# Patient Record
Sex: Female | Born: 1990 | Race: White | Hispanic: No | Marital: Single
Health system: Southern US, Community
[De-identification: ages and names within clinical notes are randomized; demographics above are authoritative.]

---

## 2014-09-27 ENCOUNTER — Emergency Department (HOSPITAL_COMMUNITY): Payer: No Typology Code available for payment source

## 2014-09-27 ENCOUNTER — Emergency Department (HOSPITAL_COMMUNITY)
Admission: EM | Admit: 2014-09-27 | Discharge: 2014-09-27 | Disposition: A | Discharge status: Home / Self Care | Payer: Self-pay | Attending: Emergency Medicine | Admitting: Emergency Medicine

## 2014-09-27 DIAGNOSIS — S8991XA Unspecified injury of right lower leg, initial encounter: Secondary | ICD-10-CM | POA: Insufficient documentation

## 2014-09-27 DIAGNOSIS — Y998 Other external cause status: Secondary | ICD-10-CM | POA: Insufficient documentation

## 2014-09-27 DIAGNOSIS — Y9389 Activity, other specified: Secondary | ICD-10-CM | POA: Insufficient documentation

## 2014-09-27 DIAGNOSIS — E669 Obesity, unspecified: Secondary | ICD-10-CM | POA: Insufficient documentation

## 2014-09-27 DIAGNOSIS — Y92410 Unspecified street and highway as the place of occurrence of the external cause: Secondary | ICD-10-CM | POA: Insufficient documentation

## 2014-09-27 DIAGNOSIS — S60221A Contusion of right hand, initial encounter: Secondary | ICD-10-CM | POA: Insufficient documentation

## 2014-09-27 MED ORDER — KETOROLAC TROMETHAMINE 30 MG/ML IJ SOLN
30.0000 mg | Freq: Once | INTRAMUSCULAR | Status: AC
  Administered 2014-09-27: 30 mg via INTRAVENOUS
  Filled 2014-09-27: qty 1

## 2014-09-27 MED ORDER — TRAMADOL HCL 50 MG PO TABS: 50.0000 mg | ORAL_TABLET | Freq: Four times a day (QID) | ORAL | Status: AC | PRN

## 2014-09-27 MED ORDER — IBUPROFEN 800 MG PO TABS: 800.0000 mg | ORAL_TABLET | Freq: Three times a day (TID) | ORAL | Status: AC

## 2014-09-27 NOTE — ED Notes (Signed)
Dr.Lockwood at bedside  
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
## 2014-09-27 NOTE — ED Notes (Signed)
Pt returned from xray, monitor reapplied 
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
